# Patient Record
Sex: Male | Born: 2000 | Race: White | Hispanic: No | Marital: Single | Smoking: Current every day smoker
Health system: Southern US, Community
[De-identification: ages and names within clinical notes are randomized; demographics above are authoritative.]

## PROBLEM LIST (undated history)

## (undated) DIAGNOSIS — F1910 Other psychoactive substance abuse, uncomplicated: Secondary | ICD-10-CM

---
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
## 2022-10-16 NOTE — Progress Notes (Signed)
noted 

## 2024-05-01 ENCOUNTER — Other Ambulatory Visit: Payer: Self-pay

## 2024-05-01 ENCOUNTER — Ambulatory Visit
Admission: EM | Admit: 2024-05-01 | Discharge: 2024-05-01 | Disposition: A | Discharge status: Home / Self Care | Payer: Self-pay

## 2024-05-01 ENCOUNTER — Ambulatory Visit: Payer: Self-pay

## 2024-05-01 DIAGNOSIS — R0982 Postnasal drip: Secondary | ICD-10-CM | POA: Insufficient documentation

## 2024-05-01 DIAGNOSIS — R6883 Chills (without fever): Secondary | ICD-10-CM | POA: Insufficient documentation

## 2024-05-01 DIAGNOSIS — Z9151 Personal history of suicidal behavior: Secondary | ICD-10-CM | POA: Insufficient documentation

## 2024-05-01 DIAGNOSIS — F1290 Cannabis use, unspecified, uncomplicated: Secondary | ICD-10-CM | POA: Insufficient documentation

## 2024-05-01 DIAGNOSIS — F17290 Nicotine dependence, other tobacco product, uncomplicated: Secondary | ICD-10-CM | POA: Insufficient documentation

## 2024-05-01 DIAGNOSIS — J029 Acute pharyngitis, unspecified: Secondary | ICD-10-CM | POA: Insufficient documentation

## 2024-05-01 DIAGNOSIS — Z79899 Other long term (current) drug therapy: Secondary | ICD-10-CM | POA: Insufficient documentation

## 2024-05-01 LAB — POCT RAPID STREP A (OFFICE): Rapid Strep A Screen: NEGATIVE

## 2024-05-01 NOTE — ED Triage Notes (Addendum)
 Pt presents with a chief complaint of sore throat. Symptoms have been present for approximately three days. States his tonsils are very swollen. Rates overall pain a 2/10. No fevers. OTC Mucinex taken for symptoms with no improvement/relief. COVID/FLU test taken last night and it was negative.

## 2024-05-01 NOTE — ED Provider Notes (Signed)
 " [facility]    CSN: [id number] Arrival date & time: 05/01/24  1147      History   Chief Complaint Chief Complaint  Patient presents with   Sore Throat    HPI Jesus Miles is a 23 y.o. male.   HPI  Pt is here today with concerns for sore throat and swollen tonsils. He states he has a previous hx of recurrent tonsillitis and this feels similar. He states he did have some chills last night but denies fever. He reports that home tests for COVID and flu were negative yesterday.   He denies recent sick contacts but states he has traveled from [place]  for the holidays Interventions: Mucinex   Past Medical History:  Diagnosis Date   Substance abuse [facility])     Patient Active Problem List   Diagnosis Date Noted   Suicide attempt (HCC) 03/29/2015   ADHD (attention deficit hyperactivity disorder), combined type 03/29/2015   Cannabis abuse 03/29/2015   Alcohol abuse 03/29/2015   Opioid abuse (HCC) 03/29/2015   MDD (major depressive disorder), recurrent episode, severe (HCC) 03/28/2015    History reviewed. No pertinent surgical history.     Home Medications    Prior to Admission medications  Medication Sig Start Date End Date Taking? Authorizing Provider  cloNIDine HCl (KAPVAY) 0.1 MG TB12 ER tablet Take by mouth. 04/01/24  Yes [provider]  naltrexone (DEPADE) 50 MG tablet Take 50 mg by mouth daily. 03/27/24  Yes [provider]  propranolol (INDERAL) 20 MG tablet Take 20 mg by mouth daily. 03/27/24  Yes [provider]  Divalproex Sodium (DEPAKOTE ER PO) Take 750 mg by mouth daily at 10 pm.    [provider]  FLUoxetine (PROZAC) 20 MG capsule Take 20 mg by mouth daily.    [provider]  guanFACINE  (INTUNIV ) 4 MG TB24 SR tablet Take 4 mg by mouth daily. 04/03/15   [provider]    Family History Family History  Adopted: Yes    Social History Social History[1]   Allergies   Patient has no  known allergies.   Review of Systems Review of Systems  Constitutional:  Positive for chills. Negative for fever.  HENT:  Positive for congestion, postnasal drip and sore throat. Negative for ear pain (ears are itchy) and rhinorrhea.   Respiratory:  Positive for cough. Negative for shortness of breath and wheezing.   Gastrointestinal:  Negative for diarrhea, nausea and vomiting.  Musculoskeletal:  Positive for myalgias.     Physical Exam Triage Vital Signs ED Triage Vitals  Encounter Vitals Group     BP 05/01/24 1235 95/62     Girls Systolic BP Percentile --      Girls Diastolic BP Percentile --      Boys Systolic BP Percentile --      Boys Diastolic BP Percentile --      Pulse Rate 05/01/24 1235 65     Resp 05/01/24 1235 18     Temp 05/01/24 1235 (!) 97.4 F (36.3 C)     Temp Source 05/01/24 1235 Oral     SpO2 05/01/24 1235 97 %     Weight 05/01/24 1233 180 lb (81.6 kg)     Height 05/01/24 1233 5' 10 (1.778 m)     Head Circumference --      Peak Flow --      Pain Score 05/01/24 1233 2     Pain Loc --      Pain  Education --      Exclude from [employer] Chart --    No data found.  Updated Vital Signs BP 95/62 (BP Location: Right Arm)   Pulse 65   Temp (!) 97.4 F (36.3 C) (Oral)   Resp 18   Ht 5' 10 (1.778 m)   Wt 180 lb (81.6 kg)   SpO2 97%   BMI 25.83 kg/m   Visual Acuity Right Eye Distance:   Left Eye Distance:   Bilateral Distance:    Right Eye Near:   Left Eye Near:    Bilateral Near:     Physical Exam Vitals reviewed.  Constitutional:      General: He is awake.     Appearance: Normal appearance. He is well-developed and well-groomed.  HENT:     Head: Normocephalic and atraumatic.     Right Ear: Hearing, tympanic membrane and ear canal normal.     Left Ear: Hearing, tympanic membrane and ear canal normal.     Mouth/Throat:     Lips: Pink.     Mouth: Mucous membranes are moist.     Pharynx: Oropharynx is clear. Uvula midline. Posterior  oropharyngeal erythema and postnasal drip present. No pharyngeal swelling, oropharyngeal exudate or uvula swelling.     Tonsils: No tonsillar exudate or tonsillar abscesses. 0 on the right. 1+ on the left.  Eyes:     General: Lids are normal. Gaze aligned appropriately.     Extraocular Movements: Extraocular movements intact.     Conjunctiva/sclera: Conjunctivae normal.  Cardiovascular:     Rate and Rhythm: Normal rate and regular rhythm.     Heart sounds: Normal heart sounds.  Pulmonary:     Effort: Pulmonary effort is normal.     Breath sounds: Normal breath sounds. No decreased air movement. No decreased breath sounds, wheezing, rhonchi or rales.  Musculoskeletal:     Cervical back: Normal range of motion and neck supple.  Lymphadenopathy:     Head:     Right side of head: No submental, submandibular or preauricular adenopathy.     Left side of head: No submental, submandibular or preauricular adenopathy.     Cervical:     Right cervical: No superficial cervical adenopathy.    Left cervical: No superficial cervical adenopathy.     Upper Body:     Right upper body: No supraclavicular adenopathy.     Left upper body: No supraclavicular adenopathy.  Skin:    General: Skin is warm and dry.  Neurological:     General: No focal deficit present.     Mental Status: He is alert and oriented to person, place, and time.  Psychiatric:        Attention and Perception: Attention normal.        Mood and Affect: Mood normal.        Speech: Speech normal.        Behavior: Behavior normal. Behavior is cooperative.        Thought Content: Thought content normal.        Judgment: Judgment normal.      UC Treatments / Results  Labs (all labs ordered are listed, but only abnormal results are displayed) Labs Reviewed  POCT RAPID STREP A (OFFICE) - Normal  CULTURE, GROUP A STREP [facility])    EKG   Radiology No results found.  Procedures Procedures (including critical care  time)  Medications Ordered in UC Medications - No data to display  Initial Impression / Assessment and Plan /  UC Course  I have reviewed the triage vital signs and the nursing notes.  Pertinent labs & imaging results that were available during my care of the patient were reviewed by me and considered in my medical decision making (see chart for details).     Patient declined prescription for viscous lidocaine stating that he will use OTC medications for symptomatic relief. Final Clinical Impressions(s) / UC Diagnoses   Final diagnoses:  Sore throat   Patient presents today with concerns for sore throat that has been ongoing for about 3 days.  He reports mild chills as well but no obvious fevers.  He reports that he also has a previous history of recurrent tonsillitis as well as strep.  Rapid strep was negative.  Physical exam is notable for erythema and evidence of postnasal drainage but no exudates or tonsillar swelling.  For now recommend symptomatic management with Flonase, second-generation antihistamine.  Offered prescription for viscous lidocaine but patient declined this stating that he will use OTC medications for symptomatic relief.  Will send strep culture for definitive rule out.  Follow-up as needed.  Discharge Instructions   None    ED Prescriptions   None    PDMP not reviewed this encounter.     [1]  Social History Tobacco Use   Smoking status: Every Day   Smokeless tobacco: Current  Vaping Use   Vaping status: Every Day  Substance Use Topics   Alcohol use: No   Drug use: Yes    Types: Marijuana, Heroin     Dal Blew, Rocky BRAVO, PA-C 05/01/24 1358  "

## 2024-05-05 LAB — CULTURE, GROUP A STREP (THRC)

## 2024-05-06 ENCOUNTER — Ambulatory Visit (HOSPITAL_COMMUNITY): Payer: Self-pay
